# Patient Record
Sex: Male | Born: 2000 | Race: White | Hispanic: No | Marital: Single
Health system: Southern US, Community
[De-identification: ages and names within clinical notes are randomized; demographics above are authoritative.]

## PROBLEM LIST (undated history)

## (undated) DIAGNOSIS — J45909 Unspecified asthma, uncomplicated: Secondary | ICD-10-CM

---
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
## 2024-01-30 DIAGNOSIS — R519 Headache, unspecified: Secondary | ICD-10-CM | POA: Diagnosis not present

## 2024-04-21 ENCOUNTER — Encounter: Payer: Self-pay | Admitting: Family Medicine

## 2024-04-21 ENCOUNTER — Ambulatory Visit (INDEPENDENT_AMBULATORY_CARE_PROVIDER_SITE_OTHER): Admitting: Family Medicine

## 2024-04-21 VITALS — BP 133/92 | HR 83 | Temp 98.0°F | Ht 70.0 in | Wt 189.4 lb

## 2024-04-21 DIAGNOSIS — R030 Elevated blood-pressure reading, without diagnosis of hypertension: Secondary | ICD-10-CM | POA: Diagnosis not present

## 2024-04-21 DIAGNOSIS — R41840 Attention and concentration deficit: Secondary | ICD-10-CM | POA: Diagnosis not present

## 2024-04-21 DIAGNOSIS — Z23 Encounter for immunization: Secondary | ICD-10-CM | POA: Diagnosis not present

## 2024-04-21 DIAGNOSIS — Z7689 Persons encountering health services in other specified circumstances: Secondary | ICD-10-CM

## 2024-04-21 DIAGNOSIS — R748 Abnormal levels of other serum enzymes: Secondary | ICD-10-CM

## 2024-04-21 DIAGNOSIS — E663 Overweight: Secondary | ICD-10-CM

## 2024-04-21 DIAGNOSIS — Z1159 Encounter for screening for other viral diseases: Secondary | ICD-10-CM

## 2024-04-21 DIAGNOSIS — F17290 Nicotine dependence, other tobacco product, uncomplicated: Secondary | ICD-10-CM

## 2024-04-21 NOTE — Progress Notes (Unsigned)
   Established Patient Office Visit  Subjective   Patient ID: Trevor Bridges, male    DOB: March 03, 2001  Age: 23 y.o. MRN: [id number]  Chief Complaint  Patient presents with  . New Patient (Initial Visit)    Patient states that he went to urgent care and had blood work done stated that his calcium was abnormal and liver had some inflammation.    Tdap Vaccine- yes Flu Vaccine- yes   Discussed the use of AI scribe software for clinical note transcription with the patient, who gave verbal consent to proceed. History of Present Illness      {History (Optional):23778}    04/21/2024   11:06 AM  Depression screen PHQ 2/9  Decreased Interest 0  Down, Depressed, Hopeless 0  PHQ - 2 Score 0        No data to display           ROS  Negative unless indicated in HPI   Objective:     BP (!) 133/92 (BP Location: Left Arm, Patient Position: Sitting, Cuff Size: Normal)   Pulse 83   Temp 98 F (36.7 C) (Oral)   Ht 5' 10 (1.778 m)   Wt 189 lb 6.4 oz (85.9 kg)   SpO2 99%   BMI 27.18 kg/m  {Vitals History (Optional):23777}  Physical Exam   No results found for any visits on 04/21/24.  {Labs (Optional):23779}  The ASCVD Risk score (Arnett DK, et al., 2019) failed to calculate for the following reasons:   The 2019 ASCVD risk score is only valid for ages 57 to 60    Assessment & Plan:  Elevated blood-pressure reading without diagnosis of hypertension -     Lipid panel -     Comprehensive metabolic panel with GFR -     TSH  Elevated liver enzymes -     Comprehensive metabolic panel with GFR  Serum calcium elevated -     Comprehensive metabolic panel with GFR  Overweight (BMI 25.0-29.9) -     CBC  Screening for viral disease -     Hepatitis C antibody -     HIV Antibody (routine testing w rflx)  Attention and concentration deficit     Assessment and Plan Assessment & Plan       Return in about 4 weeks (around 05/19/2024) for BP check.    Curtis DELENA Boom, FNP

## 2024-04-22 ENCOUNTER — Encounter: Payer: Self-pay | Admitting: Family Medicine

## 2024-04-22 DIAGNOSIS — R030 Elevated blood-pressure reading, without diagnosis of hypertension: Secondary | ICD-10-CM | POA: Insufficient documentation

## 2024-04-22 DIAGNOSIS — F17290 Nicotine dependence, other tobacco product, uncomplicated: Secondary | ICD-10-CM | POA: Insufficient documentation

## 2024-04-22 DIAGNOSIS — R41840 Attention and concentration deficit: Secondary | ICD-10-CM | POA: Insufficient documentation

## 2024-04-22 DIAGNOSIS — E663 Overweight: Secondary | ICD-10-CM | POA: Insufficient documentation

## 2024-04-23 ENCOUNTER — Ambulatory Visit: Payer: Self-pay | Admitting: Family Medicine

## 2024-04-23 LAB — LIPID PANEL
Chol/HDL Ratio: 1.7 ratio (ref 0.0–5.0)
Cholesterol, Total: 58 mg/dL — ABNORMAL LOW (ref 100–199)
HDL: 35 mg/dL — ABNORMAL LOW (ref >39)
LDL Chol Calc (NIH): 11 mg/dL (ref 0–99)
Triglycerides: 34 mg/dL (ref 0–149)
VLDL Cholesterol Cal: 12 mg/dL (ref 5–40)

## 2024-04-23 LAB — COMPREHENSIVE METABOLIC PANEL WITH GFR
ALT: 67 IU/L — ABNORMAL HIGH (ref 0–44)
AST: 29 IU/L (ref 0–40)
Albumin: 4.7 g/dL (ref 4.3–5.2)
Alkaline Phosphatase: 81 IU/L (ref 47–123)
BUN/Creatinine Ratio: 18 (ref 9–20)
BUN: 22 mg/dL — ABNORMAL HIGH (ref 6–20)
Bilirubin Total: 0.6 mg/dL (ref 0.0–1.2)
CO2: 21 mmol/L (ref 20–29)
Calcium: 9.5 mg/dL (ref 8.7–10.2)
Chloride: 106 mmol/L (ref 96–106)
Creatinine, Ser: 1.19 mg/dL (ref 0.76–1.27)
Globulin, Total: 2.2 g/dL (ref 1.5–4.5)
Glucose: 89 mg/dL (ref 70–99)
Potassium: 4.3 mmol/L (ref 3.5–5.2)
Sodium: 142 mmol/L (ref 134–144)
Total Protein: 6.9 g/dL (ref 6.0–8.5)
eGFR: 87 mL/min/1.73 (ref >59)

## 2024-04-23 LAB — CBC
Hematocrit: 44.9 % (ref 37.5–51.0)
Hemoglobin: 15.4 g/dL (ref 13.0–17.7)
MCH: 30.1 pg (ref 26.6–33.0)
MCHC: 34.3 g/dL (ref 31.5–35.7)
MCV: 88 fL (ref 79–97)
Platelets: 299 x10E3/uL (ref 150–450)
RBC: 5.12 x10E6/uL (ref 4.14–5.80)
RDW: 12 % (ref 11.6–15.4)
WBC: 8.2 x10E3/uL (ref 3.4–10.8)

## 2024-04-23 LAB — HEPATITIS C ANTIBODY: Hep C Virus Ab: NONREACTIVE

## 2024-04-23 LAB — HIV ANTIBODY (ROUTINE TESTING W REFLEX): HIV Screen 4th Generation wRfx: NONREACTIVE

## 2024-04-23 LAB — TSH: TSH: 0.571 u[IU]/mL (ref 0.450–4.500)

## 2024-05-20 ENCOUNTER — Ambulatory Visit: Admitting: Family Medicine

## 2024-06-22 ENCOUNTER — Ambulatory Visit: Admitting: Family Medicine
